# Patient Record
Sex: Female | Born: 1955 | Race: White | Hispanic: No | Smoking: Former smoker
Health system: Southern US, Community
[De-identification: ages and names within clinical notes are randomized; demographics above are authoritative.]

---

## 1998-03-06 ENCOUNTER — Ambulatory Visit (HOSPITAL_COMMUNITY): Admission: RE | Admit: 1998-03-06 | Discharge: 1998-03-06 | Payer: Self-pay | Admitting: Internal Medicine

## 1998-03-08 ENCOUNTER — Ambulatory Visit (HOSPITAL_COMMUNITY): Admission: RE | Admit: 1998-03-08 | Discharge: 1998-03-08 | Payer: Self-pay | Admitting: Internal Medicine

## 1998-10-18 ENCOUNTER — Other Ambulatory Visit: Admission: RE | Admit: 1998-10-18 | Discharge: 1998-10-18 | Payer: Self-pay | Admitting: Gynecology

## 1999-04-15 ENCOUNTER — Other Ambulatory Visit: Admission: RE | Admit: 1999-04-15 | Discharge: 1999-04-15 | Payer: Self-pay | Admitting: Gynecology

## 2000-02-11 ENCOUNTER — Other Ambulatory Visit: Admission: RE | Admit: 2000-02-11 | Discharge: 2000-02-11 | Payer: Self-pay | Admitting: Gynecology

## 2002-07-18 ENCOUNTER — Other Ambulatory Visit: Admission: RE | Admit: 2002-07-18 | Discharge: 2002-07-18 | Payer: Self-pay | Admitting: Gynecology

## 2003-07-04 ENCOUNTER — Other Ambulatory Visit: Admission: RE | Admit: 2003-07-04 | Discharge: 2003-07-04 | Payer: Self-pay | Admitting: Gynecology

## 2004-08-12 ENCOUNTER — Ambulatory Visit (HOSPITAL_COMMUNITY): Admission: RE | Admit: 2004-08-12 | Discharge: 2004-08-12 | Payer: Self-pay | Admitting: *Deleted

## 2005-02-10 ENCOUNTER — Other Ambulatory Visit: Admission: RE | Admit: 2005-02-10 | Discharge: 2005-02-10 | Payer: Self-pay | Admitting: Gynecology

## 2007-02-22 ENCOUNTER — Encounter: Admission: RE | Admit: 2007-02-22 | Discharge: 2007-02-22 | Payer: Self-pay | Admitting: Family Medicine

## 2007-09-04 ENCOUNTER — Emergency Department (HOSPITAL_COMMUNITY): Admission: EM | Admit: 2007-09-04 | Discharge: 2007-09-04 | Payer: Self-pay | Admitting: Emergency Medicine

## 2014-07-11 ENCOUNTER — Ambulatory Visit (INDEPENDENT_AMBULATORY_CARE_PROVIDER_SITE_OTHER): Payer: BC Managed Care – PPO

## 2014-07-11 ENCOUNTER — Encounter: Payer: Self-pay | Admitting: Podiatry

## 2014-07-11 ENCOUNTER — Ambulatory Visit (INDEPENDENT_AMBULATORY_CARE_PROVIDER_SITE_OTHER): Payer: BC Managed Care – PPO | Admitting: Podiatry

## 2014-07-11 DIAGNOSIS — M722 Plantar fascial fibromatosis: Secondary | ICD-10-CM

## 2014-07-11 DIAGNOSIS — S86899A Other injury of other muscle(s) and tendon(s) at lower leg level, unspecified leg, initial encounter: Secondary | ICD-10-CM

## 2014-07-11 NOTE — Patient Instructions (Signed)

## 2014-07-11 NOTE — Progress Notes (Signed)
   Subjective:    Patient ID: Gloria NeedlesLaura Farrell, female    DOB: April 06, 1956, 58 y.o.   MRN: [id number]  HPI Comments: "I need the feet checked"  Patient noticed while skiing her shins were aching. The guy at the ski shop put some "foot pads" into her ski boots and said it looked like her feet were over pronating and causing shins to hurt while skiing. She is interested in getting orthotics.     Review of Systems  All other systems reviewed and are negative.      Objective:   Physical Exam: I have reviewed her past medical history medications allergies surgery social history and review of systems area pulses are strongly palpable bilateral. Neurologic sensorium is intact per Semmes-Weinstein monofilament. Deep tendon reflexes are intact bilateral muscle strength is 5 over 5 dorsiflexion plantar flexion inversion everters and his musculature is intact. Orthopedic evaluation demonstrates all joints distal to the ankle full range of motion without crepitation. Cutaneous evaluation shows supple well-hydrated Q is no erythema edema saline as drainage or odor's multiple varicosities are noted but are asymptomatic. Radiographic evaluation demonstrates 3 views of bilateral foot which is demonstrating a rectus foot type. Soft tissue margins appear to be relatively normal except for the plantar fasciitis insertion site of the left heel and tibialis anterior or all of the anterior tendons as they course just beneath the anterior retinaculum. These tendons appear to be slightly more thickened in this area.        Assessment & Plan:  Assessment: History of shin splints bilateral plantar fasciitis left.  Plan: She was scanned for set of orthotics today we discussed appropriate shoe gear stretching exercises ice therapy usual medications. I will follow-up with her in the near future.

## 2014-08-16 ENCOUNTER — Ambulatory Visit: Payer: BLUE CROSS/BLUE SHIELD | Admitting: *Deleted

## 2014-08-16 DIAGNOSIS — M722 Plantar fascial fibromatosis: Secondary | ICD-10-CM

## 2014-08-16 NOTE — Patient Instructions (Signed)

## 2014-08-16 NOTE — Progress Notes (Signed)
PICKING UP MY ORTHOTICS  

## 2014-11-29 ENCOUNTER — Emergency Department (HOSPITAL_COMMUNITY)
Admission: EM | Admit: 2014-11-29 | Discharge: 2014-11-29 | Disposition: A | Discharge status: Home / Self Care | Payer: BLUE CROSS/BLUE SHIELD | Attending: Emergency Medicine | Admitting: Emergency Medicine

## 2014-11-29 ENCOUNTER — Emergency Department (HOSPITAL_COMMUNITY): Payer: BLUE CROSS/BLUE SHIELD

## 2014-11-29 ENCOUNTER — Encounter (HOSPITAL_COMMUNITY): Payer: Self-pay | Admitting: Emergency Medicine

## 2014-11-29 DIAGNOSIS — W228XXA Striking against or struck by other objects, initial encounter: Secondary | ICD-10-CM | POA: Diagnosis not present

## 2014-11-29 DIAGNOSIS — Y9389 Activity, other specified: Secondary | ICD-10-CM | POA: Diagnosis not present

## 2014-11-29 DIAGNOSIS — IMO0002 Reserved for concepts with insufficient information to code with codable children: Secondary | ICD-10-CM

## 2014-11-29 DIAGNOSIS — Z87891 Personal history of nicotine dependence: Secondary | ICD-10-CM | POA: Diagnosis not present

## 2014-11-29 DIAGNOSIS — Y9289 Other specified places as the place of occurrence of the external cause: Secondary | ICD-10-CM | POA: Diagnosis not present

## 2014-11-29 DIAGNOSIS — S81812A Laceration without foreign body, left lower leg, initial encounter: Secondary | ICD-10-CM | POA: Insufficient documentation

## 2014-11-29 DIAGNOSIS — Z79899 Other long term (current) drug therapy: Secondary | ICD-10-CM | POA: Insufficient documentation

## 2014-11-29 DIAGNOSIS — Z23 Encounter for immunization: Secondary | ICD-10-CM | POA: Diagnosis not present

## 2014-11-29 DIAGNOSIS — Y998 Other external cause status: Secondary | ICD-10-CM | POA: Insufficient documentation

## 2014-11-29 DIAGNOSIS — S8012XA Contusion of left lower leg, initial encounter: Secondary | ICD-10-CM | POA: Diagnosis not present

## 2014-11-29 DIAGNOSIS — T148XXA Other injury of unspecified body region, initial encounter: Secondary | ICD-10-CM

## 2014-11-29 MED ORDER — TETANUS-DIPHTH-ACELL PERTUSSIS 5-2.5-18.5 LF-MCG/0.5 IM SUSP
0.5000 mL | Freq: Once | INTRAMUSCULAR | Status: AC
  Administered 2014-11-29: 0.5 mL via INTRAMUSCULAR
  Filled 2014-11-29: qty 0.5

## 2014-11-29 MED ORDER — LIDOCAINE-EPINEPHRINE (PF) 2 %-1:200000 IJ SOLN
10.0000 mL | Freq: Once | INTRAMUSCULAR | Status: AC
  Administered 2014-11-29: 10 mL
  Filled 2014-11-29: qty 20

## 2014-11-29 MED ORDER — TRIPLE ANTIBIOTIC 5-400-5000 EX OINT: TOPICAL_OINTMENT | Freq: Four times a day (QID) | CUTANEOUS | Status: AC

## 2014-11-29 NOTE — ED Notes (Signed)
MD at bedside. 

## 2014-11-29 NOTE — Discharge Instructions (Signed)
We saw you in the ER for your WOUND The laceration is repaired. Please read the instructions provided on wound care. Keep the area clean and dry, apply bacitracin ointment daily and take the medications provided. RETURN TO THE ER IF THERE IS INCREASED PAIN, REDNESS, PUS COMING OUT from the wound site.  LACERATION REMOVAL IN 7-10 DAYS.   Contusion A contusion is a deep bruise. Contusions are the result of an injury that caused bleeding under the skin. The contusion may turn blue, purple, or yellow. Minor injuries will give you a painless contusion, but more severe contusions may stay painful and swollen for a few weeks.  CAUSES  A contusion is usually caused by a blow, trauma, or direct force to an area of the body. SYMPTOMS   Swelling and redness of the injured area.  Bruising of the injured area.  Tenderness and soreness of the injured area.  Pain. DIAGNOSIS  The diagnosis can be made by taking a history and physical exam. An X-ray, CT scan, or MRI may be needed to determine if there were any associated injuries, such as fractures. TREATMENT  Specific treatment will depend on what area of the body was injured. In general, the best treatment for a contusion is resting, icing, elevating, and applying cold compresses to the injured area. Over-the-counter medicines may also be recommended for pain control. Ask your caregiver what the best treatment is for your contusion. HOME CARE INSTRUCTIONS   Put ice on the injured area.  Put ice in a plastic bag.  Place a towel between your skin and the bag.  Leave the ice on for 15-20 minutes, 3-4 times a day, or as directed by your health care provider.  Only take over-the-counter or prescription medicines for pain, discomfort, or fever as directed by your caregiver. Your caregiver may recommend avoiding anti-inflammatory medicines (aspirin, ibuprofen, and naproxen) for 48 hours because these medicines may increase bruising.  Rest the injured  area.  If possible, elevate the injured area to reduce swelling. SEEK IMMEDIATE MEDICAL CARE IF:   You have increased bruising or swelling.  You have pain that is getting worse.  Your swelling or pain is not relieved with medicines. MAKE SURE YOU:   Understand these instructions.  Will watch your condition.  Will get help right away if you are not doing well or get worse. Document Released: 04/23/2005 Document Revised: 07/19/2013 Document Reviewed: 05/19/2011 [facility] Patient Information 2015 [place], [place]. This information is not intended to replace advice given to you by your health care provider. Make sure you discuss any questions you have with your health care provider.  Laceration Care, Adult A laceration is a cut or lesion that goes through all layers of the skin and into the tissue just beneath the skin. TREATMENT  Some lacerations may not require closure. Some lacerations may not be able to be closed due to an increased risk of infection. It is important to see your caregiver as soon as possible after an injury to minimize the risk of infection and maximize the opportunity for successful closure. If closure is appropriate, pain medicines may be given, if needed. The wound will be cleaned to help prevent infection. Your caregiver will use stitches (sutures), staples, wound glue (adhesive), or skin adhesive strips to repair the laceration. These tools bring the skin edges together to allow for faster healing and a better cosmetic outcome. However, all wounds will heal with a scar. Once the wound has healed, scarring can be minimized by  covering the wound with sunscreen during the day for 1 full year. HOME CARE INSTRUCTIONS  For sutures or staples:  Keep the wound clean and dry.  If you were given a bandage (dressing), you should change it at least once a day. Also, change the dressing if it becomes wet or dirty, or as directed by your caregiver.  Wash the wound with soap and  water 2 times a day. Rinse the wound off with water to remove all soap. Pat the wound dry with a clean towel.  After cleaning, apply a thin layer of the antibiotic ointment as recommended by your caregiver. This will help prevent infection and keep the dressing from sticking.  You may shower as usual after the first 24 hours. Do not soak the wound in water until the sutures are removed.  Only take over-the-counter or prescription medicines for pain, discomfort, or fever as directed by your caregiver.  Get your sutures or staples removed as directed by your caregiver. For skin adhesive strips:  Keep the wound clean and dry.  Do not get the skin adhesive strips wet. You may bathe carefully, using caution to keep the wound dry.  If the wound gets wet, pat it dry with a clean towel.  Skin adhesive strips will fall off on their own. You may trim the strips as the wound heals. Do not remove skin adhesive strips that are still stuck to the wound. They will fall off in time. For wound adhesive:  You may briefly wet your wound in the shower or bath. Do not soak or scrub the wound. Do not swim. Avoid periods of heavy perspiration until the skin adhesive has fallen off on its own. After showering or bathing, gently pat the wound dry with a clean towel.  Do not apply liquid medicine, cream medicine, or ointment medicine to your wound while the skin adhesive is in place. This may loosen the film before your wound is healed.  If a dressing is placed over the wound, be careful not to apply tape directly over the skin adhesive. This may cause the adhesive to be pulled off before the wound is healed.  Avoid prolonged exposure to sunlight or tanning lamps while the skin adhesive is in place. Exposure to ultraviolet light in the first year will darken the scar.  The skin adhesive will usually remain in place for 5 to 10 days, then naturally fall off the skin. Do not pick at the adhesive film. You may need  a tetanus shot if:  You cannot remember when you had your last tetanus shot.  You have never had a tetanus shot. If you get a tetanus shot, your arm may swell, get red, and feel warm to the touch. This is common and not a problem. If you need a tetanus shot and you choose not to have one, there is a rare chance of getting tetanus. Sickness from tetanus can be serious. SEEK MEDICAL CARE IF:   You have redness, swelling, or increasing pain in the wound.  You see a red line that goes away from the wound.  You have yellowish-white fluid (pus) coming from the wound.  You have a fever.  You notice a bad smell coming from the wound or dressing.  Your wound breaks open before or after sutures have been removed.  You notice something coming out of the wound such as wood or glass.  Your wound is on your hand or foot and you cannot move a finger  or toe. SEEK IMMEDIATE MEDICAL CARE IF:   Your pain is not controlled with prescribed medicine.  You have severe swelling around the wound causing pain and numbness or a change in color in your arm, hand, leg, or foot.  Your wound splits open and starts bleeding.  You have worsening numbness, weakness, or loss of function of any joint around or beyond the wound.  You develop painful lumps near the wound or on the skin anywhere on your body. MAKE SURE YOU:   Understand these instructions.  Will watch your condition.  Will get help right away if you are not doing well or get worse. Document Released: 07/14/2005 Document Revised: 10/06/2011 Document Reviewed: 01/07/2011 [facility] Patient Information 2015 [place], [place]. This information is not intended to replace advice given to you by your health care provider. Make sure you discuss any questions you have with your health care provider.  Stitches, Staples, or Skin Adhesive Strips  Stitches (sutures), staples, and skin adhesive strips hold the skin together as it heals. They will usually be in  place for 7 days or less. HOME CARE  Wash your hands with soap and water before and after you touch your wound.  Only take medicine as told by your doctor.  Cover your wound only if your doctor told you to. Otherwise, leave it open to air.  Do not get your stitches wet or dirty. If they get dirty, dab them gently with a clean washcloth. Wet the washcloth with soapy water. Do not rub. Pat them dry gently.  Do not put medicine or medicated cream on your stitches unless your doctor told you to.  Do not take out your own stitches or staples. Skin adhesive strips will fall off by themselves.  Do not pick at the wound. Picking can cause an infection.  Do not miss your follow-up appointment.  If you have problems or questions, call your doctor. GET HELP RIGHT AWAY IF:   You have a temperature by mouth above 102 F (38.9 C), not controlled by medicine.  You have chills.  You have redness or pain around your stitches.  There is puffiness (swelling) around your stitches.  You notice fluid (drainage) from your stitches.  There is a bad smell coming from your wound. MAKE SURE YOU:  Understand these instructions.  Will watch your condition.  Will get help if you are not doing well or get worse. Document Released: 05/11/2009 Document Revised: 10/06/2011 Document Reviewed: 05/11/2009 [facility]ExitCare Patient Information 2015 [place], [place]. This information is not intended to replace advice given to you by your health care provider. Make sure you discuss any questions you have with your health care provider.

## 2014-11-29 NOTE — ED Notes (Signed)
Pt reports that she was at the gym doing box jumps when she struck her left shin on the corner of the box. Pt was evaluated at urgent care and sent here due to the wound being possibly to the bone. NAD at this time.

## 2014-11-29 NOTE — ED Notes (Signed)
Suture cart at bedside 

## 2014-11-29 NOTE — ED Provider Notes (Signed)
CSN: [id number]     Arrival date & time 11/29/14  0857 History   First MD Initiated Contact with Patient 11/29/14 0914     Chief Complaint  Patient presents with  . Extremity Laceration     (Consider location/radiation/quality/duration/timing/severity/associated sxs/prior Treatment) HPI Comments: Pt comes in with cc of laceration. She reports accidentally striking her shin to wooden box. Pt went to urgent care, and was asked to come to the ER because her lac extended to the shin bone. Pt able to ambulate, but has localized pain. She is immunocompetent, not on blood thinners.  The history is provided by the patient.    History reviewed. No pertinent past medical history. History reviewed. No pertinent past surgical history. No family history on file. History  Substance Use Topics  . Smoking status: Former Games developermoker  . Smokeless tobacco: Not on file  . Alcohol Use: 4.2 oz/week    0 Standard drinks or equivalent, 7 Glasses of wine per week   OB History    No data available     Review of Systems  Musculoskeletal: Positive for myalgias.  Skin: Positive for wound.  Allergic/Immunologic: Negative for immunocompromised state.  Hematological: Does not bruise/bleed easily.      Allergies  Review of patient's allergies indicates no known allergies.  Home Medications   Prior to Admission medications   Medication Sig Start Date End Date Taking? Authorizing Provider  amphetamine-dextroamphetamine (ADDERALL) 20 MG tablet Take 20 mg by mouth daily.    Historical Provider, MD  lamoTRIgine (LAMICTAL) 150 MG tablet Take 150 mg by mouth daily.    Historical Provider, MD  Multiple Vitamin (MULTIVITAMIN) capsule Take 1 capsule by mouth daily.    Historical Provider, MD  neomycin-bacitracin-polymyxin (NEOSPORIN) [id number] ointment Apply topically 4 (four) times daily. 11/29/14   Derwood KaplanAnkit Jakobi Thetford, MD  omega-3 acid ethyl esters (LOVAZA) 1 G capsule Take by mouth 2 (two) times daily.    Historical  Provider, MD  Venlafaxine HCl (EFFEXOR PO) Take by mouth.    Historical Provider, MD  VITAMIN E PO Take by mouth.    Historical Provider, MD   BP 139/68 mmHg  Pulse 72  Temp(Src) 98.3 F (36.8 C) (Oral)  Resp 16  SpO2 100% Physical Exam  Constitutional: She is oriented to person, place, and time. She appears well-nourished.  HENT:  Head: Atraumatic.  Neck: Neck supple.  Cardiovascular: Normal rate.   Pulmonary/Chest: Effort normal.  Musculoskeletal:  L shin bone - there is roughly 10 cm laceration, the laceration is a V shape lesion, with a flap. There is some underlying tissue maceration.  Neurological: She is alert and oriented to person, place, and time.  Skin: Skin is warm.  Nursing note and vitals reviewed.   ED Course  Procedures (including critical care time) Labs Review Labs Reviewed - No data to display  Imaging Review Dg Tibia/fibula Left  11/29/2014   CLINICAL DATA:  Injury today, fall, hit left leg on wooded box, deep anterior distal tibia laceration, possible open fracture  EXAM: LEFT TIBIA AND FIBULA - 2 VIEW  COMPARISON:  None.  FINDINGS: Three views of the left tibia fibula submitted. No acute fracture or subluxation. There is soft tissue irregularity and small amount of soft tissue air mid anterior tibial region probable laceration.  IMPRESSION: No acute fracture or subluxation. Soft tissue injury mid anterior tibial region.   Electronically Signed   By: Natasha MeadLiviu  Pop M.D.   On: 11/29/2014 10:40     EKG Interpretation None  MDM   Final diagnoses:  Laceration  Contusion    LACERATION REPAIR Performed by: Derwood KaplanNanavati, Kyri Shader Authorized by: Derwood KaplanNanavati, Jerzie Bieri Consent: Verbal consent obtained. Risks and benefits: risks, benefits and alternatives were discussed Consent given by: patient Patient identity confirmed: provided demographic data Prepped and Draped in normal sterile fashion Wound explored  Laceration Location: Left anterior tibia  Laceration  Length: 10 cm  No Foreign Bodies seen or palpated  Anesthesia: local infiltration  Local anesthetic: lidocaine 2 % with epinephrine  Anesthetic total: 5 ml  Irrigation method: syringe Amount of cleaning: standard  Skin closure: primary  Number of sutures: 10 - nylon 4-0  Technique: simple inturrupted  Patient tolerance: Patient tolerated the procedure well with no immediate complications.   Pt comes in with laceration. Xrays show no foreign body or fractures. The wound was irrigated aggressively and repaired.     Derwood KaplanAnkit Karri Kallenbach, MD 11/29/14 1236

## 2015-04-05 ENCOUNTER — Other Ambulatory Visit: Payer: Self-pay

## 2015-04-05 DIAGNOSIS — G47419 Narcolepsy without cataplexy: Secondary | ICD-10-CM

## 2015-11-28 DIAGNOSIS — L821 Other seborrheic keratosis: Secondary | ICD-10-CM | POA: Diagnosis not present

## 2015-11-28 DIAGNOSIS — L570 Actinic keratosis: Secondary | ICD-10-CM | POA: Diagnosis not present

## 2015-11-28 DIAGNOSIS — Q825 Congenital non-neoplastic nevus: Secondary | ICD-10-CM | POA: Diagnosis not present

## 2015-11-28 DIAGNOSIS — D225 Melanocytic nevi of trunk: Secondary | ICD-10-CM | POA: Diagnosis not present

## 2015-12-31 DIAGNOSIS — F3181 Bipolar II disorder: Secondary | ICD-10-CM | POA: Diagnosis not present

## 2016-02-27 DIAGNOSIS — Q825 Congenital non-neoplastic nevus: Secondary | ICD-10-CM | POA: Diagnosis not present

## 2016-05-16 DIAGNOSIS — Z0000 Encounter for general adult medical examination without abnormal findings: Secondary | ICD-10-CM | POA: Diagnosis not present

## 2016-05-16 DIAGNOSIS — M859 Disorder of bone density and structure, unspecified: Secondary | ICD-10-CM | POA: Diagnosis not present

## 2016-05-21 DIAGNOSIS — Z800 Family history of malignant neoplasm of digestive organs: Secondary | ICD-10-CM | POA: Diagnosis not present

## 2016-05-21 DIAGNOSIS — F329 Major depressive disorder, single episode, unspecified: Secondary | ICD-10-CM | POA: Diagnosis not present

## 2016-05-21 DIAGNOSIS — M25562 Pain in left knee: Secondary | ICD-10-CM | POA: Diagnosis not present

## 2016-05-21 DIAGNOSIS — M8580 Other specified disorders of bone density and structure, unspecified site: Secondary | ICD-10-CM | POA: Diagnosis not present

## 2016-05-21 DIAGNOSIS — Z0000 Encounter for general adult medical examination without abnormal findings: Secondary | ICD-10-CM | POA: Diagnosis not present

## 2016-06-02 DIAGNOSIS — J40 Bronchitis, not specified as acute or chronic: Secondary | ICD-10-CM | POA: Diagnosis not present

## 2016-06-02 DIAGNOSIS — Z6821 Body mass index (BMI) 21.0-21.9, adult: Secondary | ICD-10-CM | POA: Diagnosis not present

## 2016-06-05 DIAGNOSIS — Z1212 Encounter for screening for malignant neoplasm of rectum: Secondary | ICD-10-CM | POA: Diagnosis not present

## 2016-06-10 DIAGNOSIS — Q825 Congenital non-neoplastic nevus: Secondary | ICD-10-CM | POA: Diagnosis not present

## 2016-06-10 DIAGNOSIS — L570 Actinic keratosis: Secondary | ICD-10-CM | POA: Diagnosis not present

## 2016-07-08 DIAGNOSIS — F310 Bipolar disorder, current episode hypomanic: Secondary | ICD-10-CM | POA: Diagnosis not present

## 2016-07-16 DIAGNOSIS — R5383 Other fatigue: Secondary | ICD-10-CM | POA: Diagnosis not present

## 2016-07-16 DIAGNOSIS — R52 Pain, unspecified: Secondary | ICD-10-CM | POA: Diagnosis not present

## 2016-07-16 DIAGNOSIS — J309 Allergic rhinitis, unspecified: Secondary | ICD-10-CM | POA: Diagnosis not present

## 2016-07-25 DIAGNOSIS — M25561 Pain in right knee: Secondary | ICD-10-CM | POA: Diagnosis not present

## 2016-07-25 DIAGNOSIS — M7050 Other bursitis of knee, unspecified knee: Secondary | ICD-10-CM | POA: Diagnosis not present

## 2016-09-03 DIAGNOSIS — Z1231 Encounter for screening mammogram for malignant neoplasm of breast: Secondary | ICD-10-CM | POA: Diagnosis not present

## 2016-09-17 DIAGNOSIS — R05 Cough: Secondary | ICD-10-CM | POA: Diagnosis not present

## 2016-12-21 IMAGING — CR DG TIBIA/FIBULA 2V*L*
3 series · 3 of 3 positions shown · non-contrast
Comparison: None.

CLINICAL DATA: Injury today, fall, hit left leg on wooded box, deep
anterior distal tibia laceration, possible open fracture

EXAM:
LEFT TIBIA AND FIBULA - 2 VIEW

[x tib-fib ap left]
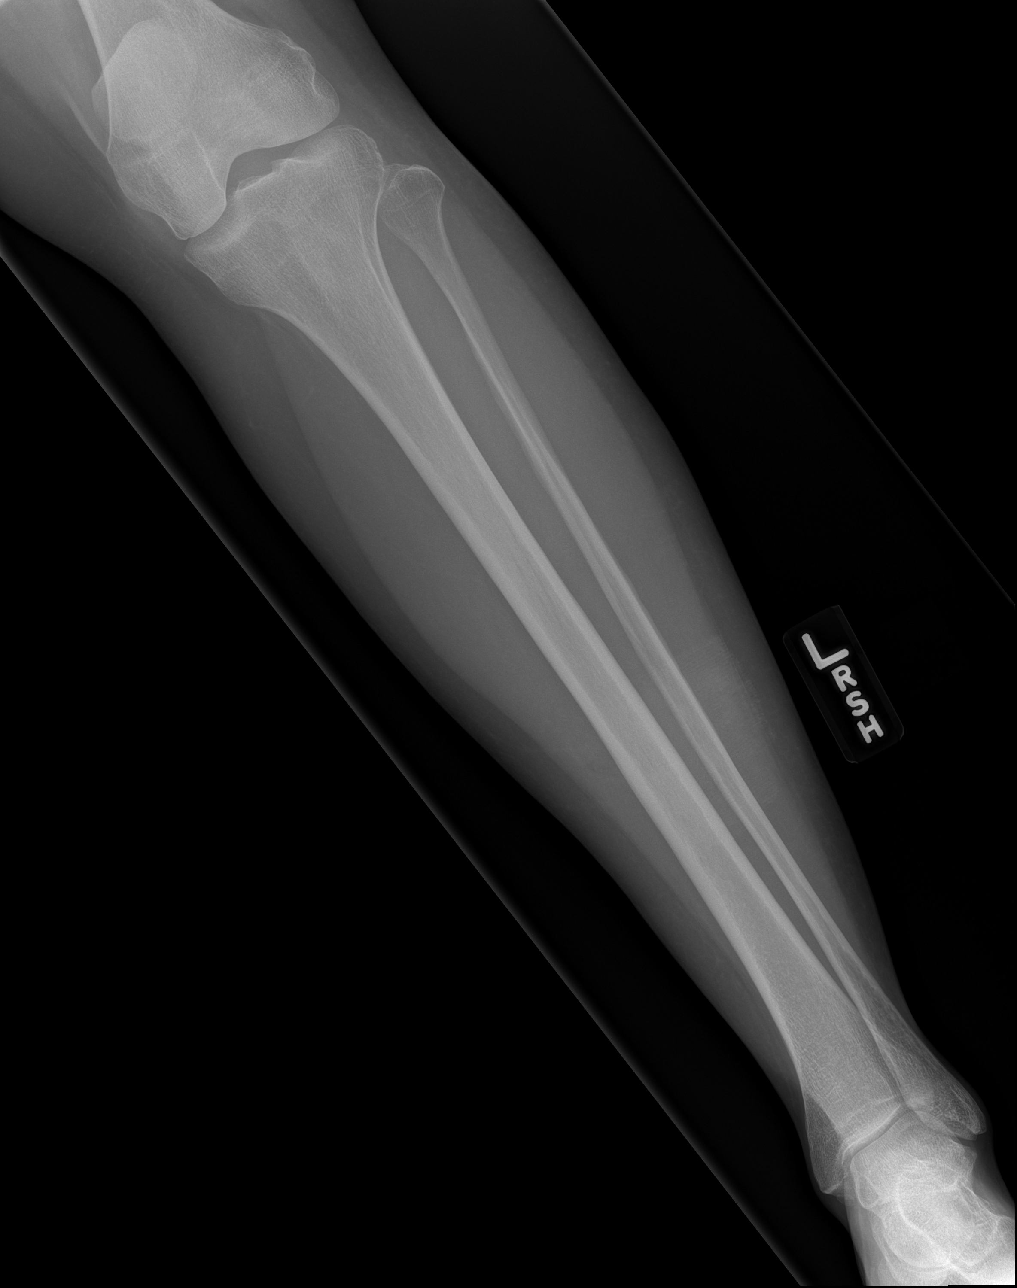

[x tib-fib lat left (1 of 2)]
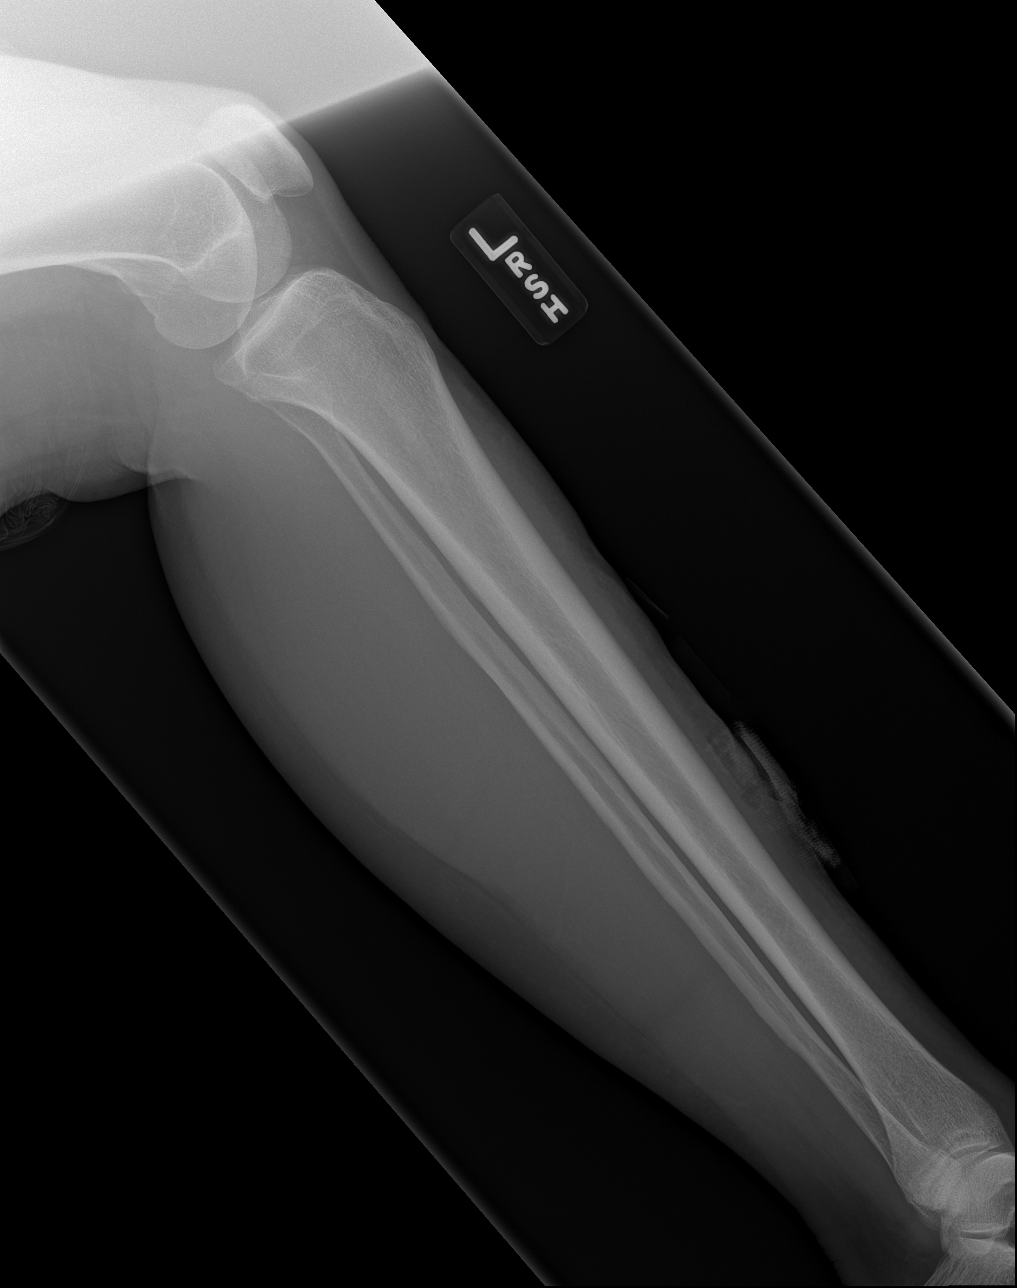

[x tib-fib lat left (2 of 2)]
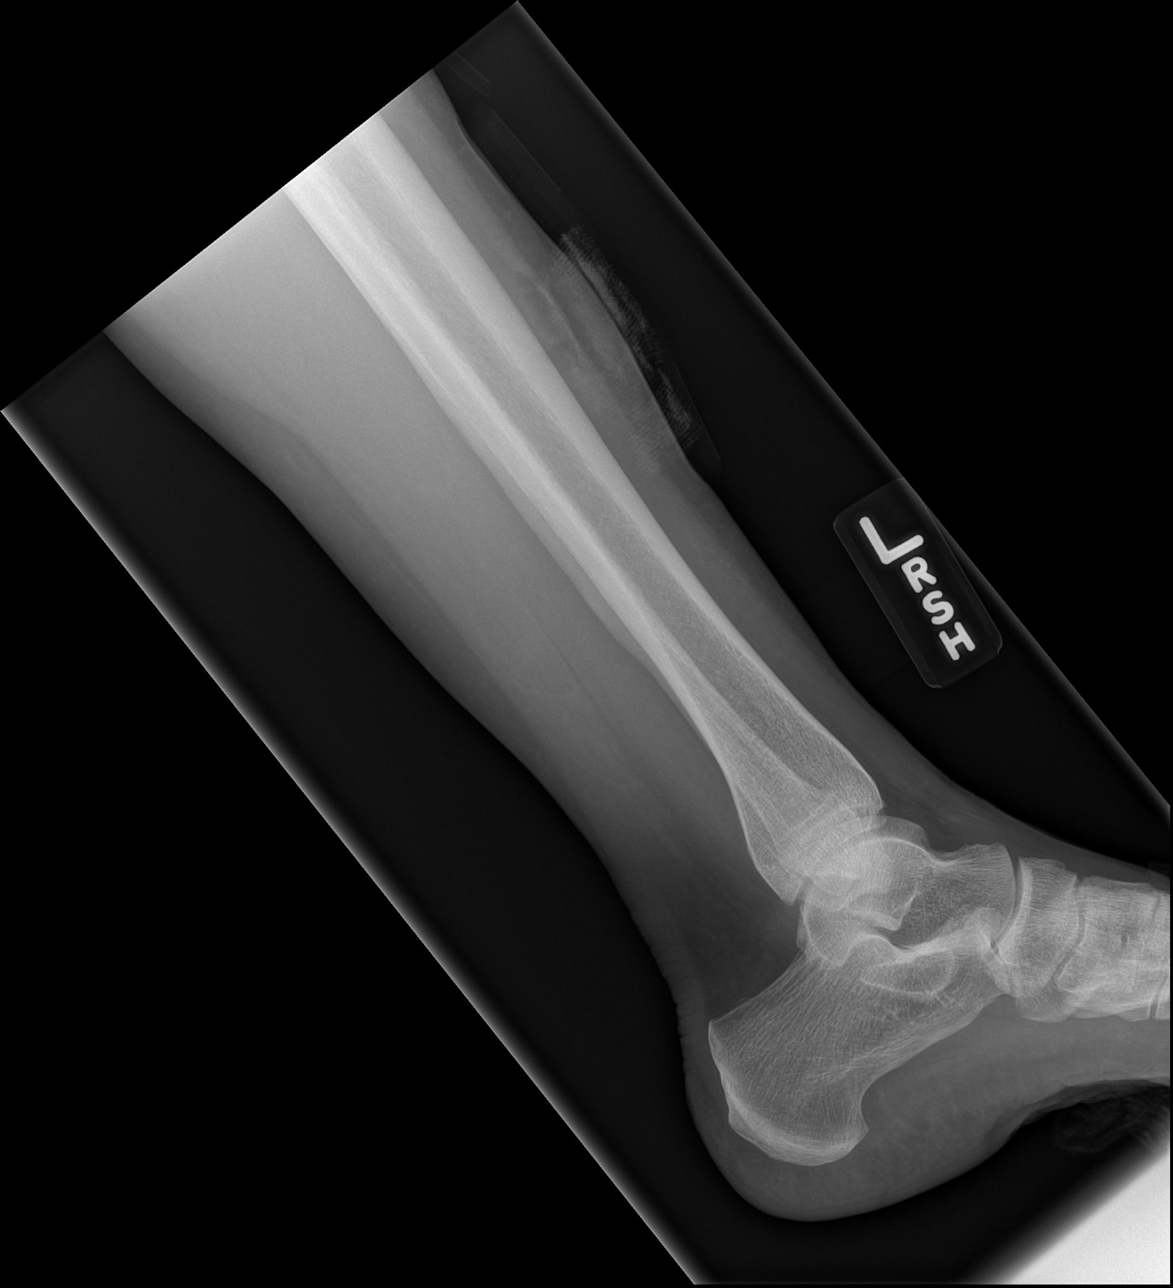

[3 of 3 positions shown; findings below may reference images not displayed]

FINDINGS: Three views of the left tibia fibula submitted. No acute fracture or
subluxation. There is soft tissue irregularity and small amount of
soft tissue air mid anterior tibial region probable laceration.
IMPRESSION: No acute fracture or subluxation. Soft tissue injury mid anterior
tibial region.

## 2016-12-29 DIAGNOSIS — R05 Cough: Secondary | ICD-10-CM | POA: Diagnosis not present

## 2016-12-29 DIAGNOSIS — J189 Pneumonia, unspecified organism: Secondary | ICD-10-CM | POA: Diagnosis not present

## 2016-12-29 DIAGNOSIS — R509 Fever, unspecified: Secondary | ICD-10-CM | POA: Diagnosis not present

## 2016-12-29 DIAGNOSIS — J309 Allergic rhinitis, unspecified: Secondary | ICD-10-CM | POA: Diagnosis not present

## 2017-01-08 DIAGNOSIS — J189 Pneumonia, unspecified organism: Secondary | ICD-10-CM | POA: Diagnosis not present

## 2017-01-08 DIAGNOSIS — Z6822 Body mass index (BMI) 22.0-22.9, adult: Secondary | ICD-10-CM | POA: Diagnosis not present

## 2017-01-08 DIAGNOSIS — R05 Cough: Secondary | ICD-10-CM | POA: Diagnosis not present

## 2017-01-15 DIAGNOSIS — F900 Attention-deficit hyperactivity disorder, predominantly inattentive type: Secondary | ICD-10-CM | POA: Diagnosis not present

## 2017-01-15 DIAGNOSIS — F3342 Major depressive disorder, recurrent, in full remission: Secondary | ICD-10-CM | POA: Diagnosis not present

## 2017-02-09 DIAGNOSIS — J189 Pneumonia, unspecified organism: Secondary | ICD-10-CM | POA: Diagnosis not present

## 2017-07-02 DIAGNOSIS — R05 Cough: Secondary | ICD-10-CM | POA: Diagnosis not present

## 2017-07-02 DIAGNOSIS — J209 Acute bronchitis, unspecified: Secondary | ICD-10-CM | POA: Diagnosis not present

## 2017-07-02 DIAGNOSIS — Z6823 Body mass index (BMI) 23.0-23.9, adult: Secondary | ICD-10-CM | POA: Diagnosis not present

## 2017-07-13 DIAGNOSIS — F3342 Major depressive disorder, recurrent, in full remission: Secondary | ICD-10-CM | POA: Diagnosis not present

## 2017-09-30 DIAGNOSIS — M25511 Pain in right shoulder: Secondary | ICD-10-CM | POA: Diagnosis not present

## 2017-11-17 DIAGNOSIS — R5383 Other fatigue: Secondary | ICD-10-CM | POA: Diagnosis not present

## 2017-11-17 DIAGNOSIS — Z0000 Encounter for general adult medical examination without abnormal findings: Secondary | ICD-10-CM | POA: Diagnosis not present

## 2017-11-17 DIAGNOSIS — M859 Disorder of bone density and structure, unspecified: Secondary | ICD-10-CM | POA: Diagnosis not present

## 2017-11-20 DIAGNOSIS — Z0000 Encounter for general adult medical examination without abnormal findings: Secondary | ICD-10-CM | POA: Diagnosis not present

## 2017-11-20 DIAGNOSIS — J3089 Other allergic rhinitis: Secondary | ICD-10-CM | POA: Diagnosis not present

## 2017-11-20 DIAGNOSIS — K5730 Diverticulosis of large intestine without perforation or abscess without bleeding: Secondary | ICD-10-CM | POA: Diagnosis not present

## 2017-11-20 DIAGNOSIS — Z1389 Encounter for screening for other disorder: Secondary | ICD-10-CM | POA: Diagnosis not present

## 2017-11-20 DIAGNOSIS — J189 Pneumonia, unspecified organism: Secondary | ICD-10-CM | POA: Diagnosis not present

## 2017-11-20 DIAGNOSIS — Z800 Family history of malignant neoplasm of digestive organs: Secondary | ICD-10-CM | POA: Diagnosis not present

## 2017-11-27 DIAGNOSIS — Z1212 Encounter for screening for malignant neoplasm of rectum: Secondary | ICD-10-CM | POA: Diagnosis not present

## 2017-12-14 DIAGNOSIS — Z1231 Encounter for screening mammogram for malignant neoplasm of breast: Secondary | ICD-10-CM | POA: Diagnosis not present

## 2017-12-14 DIAGNOSIS — M8589 Other specified disorders of bone density and structure, multiple sites: Secondary | ICD-10-CM | POA: Diagnosis not present

## 2018-06-21 DIAGNOSIS — F900 Attention-deficit hyperactivity disorder, predominantly inattentive type: Secondary | ICD-10-CM | POA: Diagnosis not present

## 2018-06-21 DIAGNOSIS — F3342 Major depressive disorder, recurrent, in full remission: Secondary | ICD-10-CM | POA: Diagnosis not present

## 2018-09-14 DIAGNOSIS — L570 Actinic keratosis: Secondary | ICD-10-CM | POA: Diagnosis not present

## 2018-09-14 DIAGNOSIS — Z23 Encounter for immunization: Secondary | ICD-10-CM | POA: Diagnosis not present

## 2018-09-14 DIAGNOSIS — D485 Neoplasm of uncertain behavior of skin: Secondary | ICD-10-CM | POA: Diagnosis not present

## 2019-03-15 DIAGNOSIS — F3189 Other bipolar disorder: Secondary | ICD-10-CM | POA: Diagnosis not present

## 2019-03-24 DIAGNOSIS — L821 Other seborrheic keratosis: Secondary | ICD-10-CM | POA: Diagnosis not present

## 2019-03-24 DIAGNOSIS — D2372 Other benign neoplasm of skin of left lower limb, including hip: Secondary | ICD-10-CM | POA: Diagnosis not present

## 2019-03-24 DIAGNOSIS — Q825 Congenital non-neoplastic nevus: Secondary | ICD-10-CM | POA: Diagnosis not present

## 2019-03-24 DIAGNOSIS — D225 Melanocytic nevi of trunk: Secondary | ICD-10-CM | POA: Diagnosis not present

## 2019-06-15 DIAGNOSIS — Z1231 Encounter for screening mammogram for malignant neoplasm of breast: Secondary | ICD-10-CM | POA: Diagnosis not present

## 2019-08-08 DIAGNOSIS — M25511 Pain in right shoulder: Secondary | ICD-10-CM | POA: Diagnosis not present

## 2019-08-08 DIAGNOSIS — M19011 Primary osteoarthritis, right shoulder: Secondary | ICD-10-CM | POA: Diagnosis not present

## 2019-08-15 DIAGNOSIS — M25511 Pain in right shoulder: Secondary | ICD-10-CM | POA: Diagnosis not present

## 2019-08-15 DIAGNOSIS — M19011 Primary osteoarthritis, right shoulder: Secondary | ICD-10-CM | POA: Diagnosis not present

## 2019-08-17 DIAGNOSIS — M79601 Pain in right arm: Secondary | ICD-10-CM | POA: Diagnosis not present

## 2019-08-22 DIAGNOSIS — M542 Cervicalgia: Secondary | ICD-10-CM | POA: Diagnosis not present

## 2019-08-22 DIAGNOSIS — M25511 Pain in right shoulder: Secondary | ICD-10-CM | POA: Diagnosis not present

## 2019-08-29 DIAGNOSIS — M79601 Pain in right arm: Secondary | ICD-10-CM | POA: Diagnosis not present

## 2019-08-29 DIAGNOSIS — M25511 Pain in right shoulder: Secondary | ICD-10-CM | POA: Diagnosis not present

## 2019-08-29 DIAGNOSIS — M542 Cervicalgia: Secondary | ICD-10-CM | POA: Diagnosis not present

## 2019-08-29 DIAGNOSIS — M19011 Primary osteoarthritis, right shoulder: Secondary | ICD-10-CM | POA: Diagnosis not present

## 2019-09-02 DIAGNOSIS — M5412 Radiculopathy, cervical region: Secondary | ICD-10-CM | POA: Diagnosis not present

## 2019-09-06 DIAGNOSIS — M5030 Other cervical disc degeneration, unspecified cervical region: Secondary | ICD-10-CM | POA: Diagnosis not present

## 2019-09-22 DIAGNOSIS — M5412 Radiculopathy, cervical region: Secondary | ICD-10-CM | POA: Diagnosis not present

## 2019-10-17 DIAGNOSIS — M50121 Cervical disc disorder at C4-C5 level with radiculopathy: Secondary | ICD-10-CM | POA: Diagnosis not present

## 2019-10-17 DIAGNOSIS — M5412 Radiculopathy, cervical region: Secondary | ICD-10-CM | POA: Diagnosis not present

## 2019-11-02 DIAGNOSIS — M5412 Radiculopathy, cervical region: Secondary | ICD-10-CM | POA: Diagnosis not present

## 2019-12-06 DIAGNOSIS — Z6822 Body mass index (BMI) 22.0-22.9, adult: Secondary | ICD-10-CM | POA: Diagnosis not present

## 2019-12-06 DIAGNOSIS — Z124 Encounter for screening for malignant neoplasm of cervix: Secondary | ICD-10-CM | POA: Diagnosis not present

## 2019-12-06 DIAGNOSIS — Z01419 Encounter for gynecological examination (general) (routine) without abnormal findings: Secondary | ICD-10-CM | POA: Diagnosis not present

## 2019-12-06 DIAGNOSIS — F900 Attention-deficit hyperactivity disorder, predominantly inattentive type: Secondary | ICD-10-CM | POA: Diagnosis not present

## 2019-12-06 DIAGNOSIS — F3342 Major depressive disorder, recurrent, in full remission: Secondary | ICD-10-CM | POA: Diagnosis not present

## 2019-12-14 DIAGNOSIS — M5412 Radiculopathy, cervical region: Secondary | ICD-10-CM | POA: Diagnosis not present

## 2020-01-23 DIAGNOSIS — R05 Cough: Secondary | ICD-10-CM | POA: Diagnosis not present

## 2020-01-23 DIAGNOSIS — Z1152 Encounter for screening for COVID-19: Secondary | ICD-10-CM | POA: Diagnosis not present

## 2020-01-23 DIAGNOSIS — J209 Acute bronchitis, unspecified: Secondary | ICD-10-CM | POA: Diagnosis not present

## 2020-01-23 DIAGNOSIS — J309 Allergic rhinitis, unspecified: Secondary | ICD-10-CM | POA: Diagnosis not present

## 2020-03-29 DIAGNOSIS — L821 Other seborrheic keratosis: Secondary | ICD-10-CM | POA: Diagnosis not present

## 2020-03-29 DIAGNOSIS — L570 Actinic keratosis: Secondary | ICD-10-CM | POA: Diagnosis not present

## 2020-03-29 DIAGNOSIS — D485 Neoplasm of uncertain behavior of skin: Secondary | ICD-10-CM | POA: Diagnosis not present

## 2020-03-29 DIAGNOSIS — D2372 Other benign neoplasm of skin of left lower limb, including hip: Secondary | ICD-10-CM | POA: Diagnosis not present

## 2020-03-29 DIAGNOSIS — D225 Melanocytic nevi of trunk: Secondary | ICD-10-CM | POA: Diagnosis not present

## 2020-03-29 DIAGNOSIS — Z808 Family history of malignant neoplasm of other organs or systems: Secondary | ICD-10-CM | POA: Diagnosis not present

## 2020-04-03 DIAGNOSIS — M8589 Other specified disorders of bone density and structure, multiple sites: Secondary | ICD-10-CM | POA: Diagnosis not present

## 2020-04-11 DIAGNOSIS — R110 Nausea: Secondary | ICD-10-CM | POA: Diagnosis not present

## 2020-04-11 DIAGNOSIS — R42 Dizziness and giddiness: Secondary | ICD-10-CM | POA: Diagnosis not present

## 2020-04-11 DIAGNOSIS — R519 Headache, unspecified: Secondary | ICD-10-CM | POA: Diagnosis not present

## 2020-08-02 DIAGNOSIS — L910 Hypertrophic scar: Secondary | ICD-10-CM | POA: Diagnosis not present

## 2020-08-02 DIAGNOSIS — L570 Actinic keratosis: Secondary | ICD-10-CM | POA: Diagnosis not present

## 2020-08-06 DIAGNOSIS — Z1231 Encounter for screening mammogram for malignant neoplasm of breast: Secondary | ICD-10-CM | POA: Diagnosis not present

## 2020-10-18 DIAGNOSIS — Z79899 Other long term (current) drug therapy: Secondary | ICD-10-CM | POA: Diagnosis not present

## 2020-10-18 DIAGNOSIS — M859 Disorder of bone density and structure, unspecified: Secondary | ICD-10-CM | POA: Diagnosis not present

## 2020-10-25 DIAGNOSIS — F329 Major depressive disorder, single episode, unspecified: Secondary | ICD-10-CM | POA: Diagnosis not present

## 2020-10-25 DIAGNOSIS — Z1212 Encounter for screening for malignant neoplasm of rectum: Secondary | ICD-10-CM | POA: Diagnosis not present

## 2020-10-25 DIAGNOSIS — Z0000 Encounter for general adult medical examination without abnormal findings: Secondary | ICD-10-CM | POA: Diagnosis not present

## 2020-10-25 DIAGNOSIS — M8580 Other specified disorders of bone density and structure, unspecified site: Secondary | ICD-10-CM | POA: Diagnosis not present

## 2020-10-25 DIAGNOSIS — K5730 Diverticulosis of large intestine without perforation or abscess without bleeding: Secondary | ICD-10-CM | POA: Diagnosis not present

## 2020-12-11 DIAGNOSIS — Z01419 Encounter for gynecological examination (general) (routine) without abnormal findings: Secondary | ICD-10-CM | POA: Diagnosis not present

## 2020-12-11 DIAGNOSIS — N898 Other specified noninflammatory disorders of vagina: Secondary | ICD-10-CM | POA: Diagnosis not present

## 2021-02-25 DIAGNOSIS — H5213 Myopia, bilateral: Secondary | ICD-10-CM | POA: Diagnosis not present

## 2021-04-04 DIAGNOSIS — D2372 Other benign neoplasm of skin of left lower limb, including hip: Secondary | ICD-10-CM | POA: Diagnosis not present

## 2021-04-04 DIAGNOSIS — L821 Other seborrheic keratosis: Secondary | ICD-10-CM | POA: Diagnosis not present

## 2021-04-04 DIAGNOSIS — L910 Hypertrophic scar: Secondary | ICD-10-CM | POA: Diagnosis not present

## 2021-04-04 DIAGNOSIS — D225 Melanocytic nevi of trunk: Secondary | ICD-10-CM | POA: Diagnosis not present

## 2021-09-09 DIAGNOSIS — Z1231 Encounter for screening mammogram for malignant neoplasm of breast: Secondary | ICD-10-CM | POA: Diagnosis not present

## 2021-09-20 DIAGNOSIS — R928 Other abnormal and inconclusive findings on diagnostic imaging of breast: Secondary | ICD-10-CM | POA: Diagnosis not present

## 2021-09-20 DIAGNOSIS — R922 Inconclusive mammogram: Secondary | ICD-10-CM | POA: Diagnosis not present

## 2021-11-13 DIAGNOSIS — R7989 Other specified abnormal findings of blood chemistry: Secondary | ICD-10-CM | POA: Diagnosis not present

## 2021-11-13 DIAGNOSIS — M8580 Other specified disorders of bone density and structure, unspecified site: Secondary | ICD-10-CM | POA: Diagnosis not present

## 2021-11-13 DIAGNOSIS — Z79899 Other long term (current) drug therapy: Secondary | ICD-10-CM | POA: Diagnosis not present

## 2021-11-22 DIAGNOSIS — Z1212 Encounter for screening for malignant neoplasm of rectum: Secondary | ICD-10-CM | POA: Diagnosis not present

## 2021-11-22 DIAGNOSIS — R82998 Other abnormal findings in urine: Secondary | ICD-10-CM | POA: Diagnosis not present

## 2021-11-22 DIAGNOSIS — M8580 Other specified disorders of bone density and structure, unspecified site: Secondary | ICD-10-CM | POA: Diagnosis not present

## 2021-11-22 DIAGNOSIS — K5730 Diverticulosis of large intestine without perforation or abscess without bleeding: Secondary | ICD-10-CM | POA: Diagnosis not present

## 2021-11-22 DIAGNOSIS — R42 Dizziness and giddiness: Secondary | ICD-10-CM | POA: Diagnosis not present

## 2021-11-22 DIAGNOSIS — Z0000 Encounter for general adult medical examination without abnormal findings: Secondary | ICD-10-CM | POA: Diagnosis not present

## 2022-05-13 DIAGNOSIS — K5730 Diverticulosis of large intestine without perforation or abscess without bleeding: Secondary | ICD-10-CM | POA: Diagnosis not present

## 2022-05-13 DIAGNOSIS — Z800 Family history of malignant neoplasm of digestive organs: Secondary | ICD-10-CM | POA: Diagnosis not present

## 2022-05-13 DIAGNOSIS — Z1211 Encounter for screening for malignant neoplasm of colon: Secondary | ICD-10-CM | POA: Diagnosis not present

## 2022-05-13 DIAGNOSIS — K648 Other hemorrhoids: Secondary | ICD-10-CM | POA: Diagnosis not present

## 2022-08-12 DIAGNOSIS — H5213 Myopia, bilateral: Secondary | ICD-10-CM | POA: Diagnosis not present

## 2022-09-22 DIAGNOSIS — Z1231 Encounter for screening mammogram for malignant neoplasm of breast: Secondary | ICD-10-CM | POA: Diagnosis not present

## 2022-10-09 DIAGNOSIS — L910 Hypertrophic scar: Secondary | ICD-10-CM | POA: Diagnosis not present

## 2022-10-09 DIAGNOSIS — L570 Actinic keratosis: Secondary | ICD-10-CM | POA: Diagnosis not present

## 2022-11-19 DIAGNOSIS — Z1212 Encounter for screening for malignant neoplasm of rectum: Secondary | ICD-10-CM | POA: Diagnosis not present

## 2022-11-19 DIAGNOSIS — Z0000 Encounter for general adult medical examination without abnormal findings: Secondary | ICD-10-CM | POA: Diagnosis not present

## 2022-11-19 DIAGNOSIS — R7989 Other specified abnormal findings of blood chemistry: Secondary | ICD-10-CM | POA: Diagnosis not present

## 2022-11-19 DIAGNOSIS — D72819 Decreased white blood cell count, unspecified: Secondary | ICD-10-CM | POA: Diagnosis not present

## 2022-11-19 DIAGNOSIS — R5383 Other fatigue: Secondary | ICD-10-CM | POA: Diagnosis not present

## 2022-11-19 DIAGNOSIS — M8580 Other specified disorders of bone density and structure, unspecified site: Secondary | ICD-10-CM | POA: Diagnosis not present

## 2022-11-26 DIAGNOSIS — D72819 Decreased white blood cell count, unspecified: Secondary | ICD-10-CM | POA: Diagnosis not present

## 2022-11-26 DIAGNOSIS — Z1331 Encounter for screening for depression: Secondary | ICD-10-CM | POA: Diagnosis not present

## 2022-11-26 DIAGNOSIS — Z1339 Encounter for screening examination for other mental health and behavioral disorders: Secondary | ICD-10-CM | POA: Diagnosis not present

## 2022-11-26 DIAGNOSIS — M8580 Other specified disorders of bone density and structure, unspecified site: Secondary | ICD-10-CM | POA: Diagnosis not present

## 2022-11-26 DIAGNOSIS — R509 Fever, unspecified: Secondary | ICD-10-CM | POA: Diagnosis not present

## 2022-11-26 DIAGNOSIS — Z0000 Encounter for general adult medical examination without abnormal findings: Secondary | ICD-10-CM | POA: Diagnosis not present

## 2022-11-26 DIAGNOSIS — J181 Lobar pneumonia, unspecified organism: Secondary | ICD-10-CM | POA: Diagnosis not present

## 2022-12-24 DIAGNOSIS — Z01419 Encounter for gynecological examination (general) (routine) without abnormal findings: Secondary | ICD-10-CM | POA: Diagnosis not present

## 2022-12-25 DIAGNOSIS — I10 Essential (primary) hypertension: Secondary | ICD-10-CM | POA: Diagnosis not present

## 2022-12-25 DIAGNOSIS — J189 Pneumonia, unspecified organism: Secondary | ICD-10-CM | POA: Diagnosis not present

## 2022-12-25 DIAGNOSIS — R82998 Other abnormal findings in urine: Secondary | ICD-10-CM | POA: Diagnosis not present

## 2023-02-18 DIAGNOSIS — K080 Exfoliation of teeth due to systemic causes: Secondary | ICD-10-CM | POA: Diagnosis not present

## 2023-05-02 DIAGNOSIS — H524 Presbyopia: Secondary | ICD-10-CM | POA: Diagnosis not present

## 2023-05-12 DIAGNOSIS — L570 Actinic keratosis: Secondary | ICD-10-CM | POA: Diagnosis not present

## 2023-05-12 DIAGNOSIS — L578 Other skin changes due to chronic exposure to nonionizing radiation: Secondary | ICD-10-CM | POA: Diagnosis not present

## 2023-05-12 DIAGNOSIS — L821 Other seborrheic keratosis: Secondary | ICD-10-CM | POA: Diagnosis not present

## 2023-05-12 DIAGNOSIS — D2372 Other benign neoplasm of skin of left lower limb, including hip: Secondary | ICD-10-CM | POA: Diagnosis not present

## 2023-05-12 DIAGNOSIS — D225 Melanocytic nevi of trunk: Secondary | ICD-10-CM | POA: Diagnosis not present

## 2023-10-06 DIAGNOSIS — H5213 Myopia, bilateral: Secondary | ICD-10-CM | POA: Diagnosis not present

## 2023-10-09 DIAGNOSIS — H524 Presbyopia: Secondary | ICD-10-CM | POA: Diagnosis not present

## 2023-10-12 DIAGNOSIS — M8588 Other specified disorders of bone density and structure, other site: Secondary | ICD-10-CM | POA: Diagnosis not present

## 2023-10-12 DIAGNOSIS — Z1231 Encounter for screening mammogram for malignant neoplasm of breast: Secondary | ICD-10-CM | POA: Diagnosis not present

## 2023-11-25 DIAGNOSIS — F319 Bipolar disorder, unspecified: Secondary | ICD-10-CM | POA: Diagnosis not present

## 2023-11-25 DIAGNOSIS — F900 Attention-deficit hyperactivity disorder, predominantly inattentive type: Secondary | ICD-10-CM | POA: Diagnosis not present

## 2023-11-25 DIAGNOSIS — Z5181 Encounter for therapeutic drug level monitoring: Secondary | ICD-10-CM | POA: Diagnosis not present

## 2023-11-25 DIAGNOSIS — Z79899 Other long term (current) drug therapy: Secondary | ICD-10-CM | POA: Diagnosis not present

## 2023-11-27 DIAGNOSIS — M8580 Other specified disorders of bone density and structure, unspecified site: Secondary | ICD-10-CM | POA: Diagnosis not present

## 2023-11-27 DIAGNOSIS — Z79899 Other long term (current) drug therapy: Secondary | ICD-10-CM | POA: Diagnosis not present

## 2023-12-04 DIAGNOSIS — Z0000 Encounter for general adult medical examination without abnormal findings: Secondary | ICD-10-CM | POA: Diagnosis not present

## 2023-12-04 DIAGNOSIS — Z1339 Encounter for screening examination for other mental health and behavioral disorders: Secondary | ICD-10-CM | POA: Diagnosis not present

## 2023-12-04 DIAGNOSIS — J181 Lobar pneumonia, unspecified organism: Secondary | ICD-10-CM | POA: Diagnosis not present

## 2023-12-04 DIAGNOSIS — Z1331 Encounter for screening for depression: Secondary | ICD-10-CM | POA: Diagnosis not present

## 2023-12-07 DIAGNOSIS — R82998 Other abnormal findings in urine: Secondary | ICD-10-CM | POA: Diagnosis not present

## 2023-12-17 DIAGNOSIS — M7542 Impingement syndrome of left shoulder: Secondary | ICD-10-CM | POA: Diagnosis not present

## 2024-01-19 DIAGNOSIS — F900 Attention-deficit hyperactivity disorder, predominantly inattentive type: Secondary | ICD-10-CM | POA: Diagnosis not present

## 2024-01-19 DIAGNOSIS — F319 Bipolar disorder, unspecified: Secondary | ICD-10-CM | POA: Diagnosis not present

## 2024-01-25 DIAGNOSIS — M7542 Impingement syndrome of left shoulder: Secondary | ICD-10-CM | POA: Diagnosis not present

## 2024-02-17 DIAGNOSIS — R058 Other specified cough: Secondary | ICD-10-CM | POA: Diagnosis not present

## 2024-02-17 DIAGNOSIS — J209 Acute bronchitis, unspecified: Secondary | ICD-10-CM | POA: Diagnosis not present

## 2024-03-09 DIAGNOSIS — F900 Attention-deficit hyperactivity disorder, predominantly inattentive type: Secondary | ICD-10-CM | POA: Diagnosis not present

## 2024-03-09 DIAGNOSIS — F319 Bipolar disorder, unspecified: Secondary | ICD-10-CM | POA: Diagnosis not present

## 2024-04-11 DIAGNOSIS — F319 Bipolar disorder, unspecified: Secondary | ICD-10-CM | POA: Diagnosis not present

## 2024-04-11 DIAGNOSIS — F900 Attention-deficit hyperactivity disorder, predominantly inattentive type: Secondary | ICD-10-CM | POA: Diagnosis not present

## 2024-05-12 DIAGNOSIS — L239 Allergic contact dermatitis, unspecified cause: Secondary | ICD-10-CM | POA: Diagnosis not present

## 2024-05-12 DIAGNOSIS — D225 Melanocytic nevi of trunk: Secondary | ICD-10-CM | POA: Diagnosis not present

## 2024-05-12 DIAGNOSIS — L578 Other skin changes due to chronic exposure to nonionizing radiation: Secondary | ICD-10-CM | POA: Diagnosis not present

## 2024-05-12 DIAGNOSIS — D2372 Other benign neoplasm of skin of left lower limb, including hip: Secondary | ICD-10-CM | POA: Diagnosis not present

## 2024-07-11 DIAGNOSIS — F319 Bipolar disorder, unspecified: Secondary | ICD-10-CM | POA: Diagnosis not present

## 2024-07-11 DIAGNOSIS — F900 Attention-deficit hyperactivity disorder, predominantly inattentive type: Secondary | ICD-10-CM | POA: Diagnosis not present
